# Patient Record
Sex: Male | Born: 1945 | Race: White | Hispanic: No | Marital: Married | Smoking: Former smoker
Health system: Southern US, Community
[De-identification: ages and names within clinical notes are randomized; demographics above are authoritative.]

## PROBLEM LIST (undated history)

## (undated) DIAGNOSIS — Z86010 Personal history of colonic polyps: Secondary | ICD-10-CM

## (undated) DIAGNOSIS — E13319 Other specified diabetes mellitus with unspecified diabetic retinopathy without macular edema: Secondary | ICD-10-CM

## (undated) DIAGNOSIS — Z1211 Encounter for screening for malignant neoplasm of colon: Secondary | ICD-10-CM

## (undated) DIAGNOSIS — C4491 Basal cell carcinoma of skin, unspecified: Secondary | ICD-10-CM

## (undated) HISTORY — PX: COLONOSCOPY: SHX174

## (undated) HISTORY — PX: EYE SURGERY: SHX253

## (undated) HISTORY — PX: OTHER SURGICAL HISTORY: SHX169

---

## 2002-05-30 ENCOUNTER — Ambulatory Visit (HOSPITAL_COMMUNITY): Admission: RE | Admit: 2002-05-30 | Discharge: 2002-05-30 | Payer: Self-pay | Admitting: Gastroenterology

## 2004-11-01 HISTORY — PX: OTHER SURGICAL HISTORY: SHX169

## 2005-01-25 ENCOUNTER — Ambulatory Visit: Payer: Self-pay | Admitting: Ophthalmology

## 2005-02-08 ENCOUNTER — Ambulatory Visit: Payer: Self-pay | Admitting: Ophthalmology

## 2005-07-21 ENCOUNTER — Ambulatory Visit: Payer: Self-pay | Admitting: Unknown Physician Specialty

## 2008-09-12 ENCOUNTER — Ambulatory Visit: Payer: Self-pay | Admitting: Unknown Physician Specialty

## 2010-08-04 ENCOUNTER — Ambulatory Visit: Payer: Self-pay

## 2011-09-03 IMAGING — CR DG CHEST 2V
1 series · 3 of 3 positions shown · non-contrast
Comparison: none

REASON FOR EXAM: cough
COMMENTS:

[Series 1: view not recorded · 0.17mm/px · 3 of 3 slices shown]
[im 1/3]
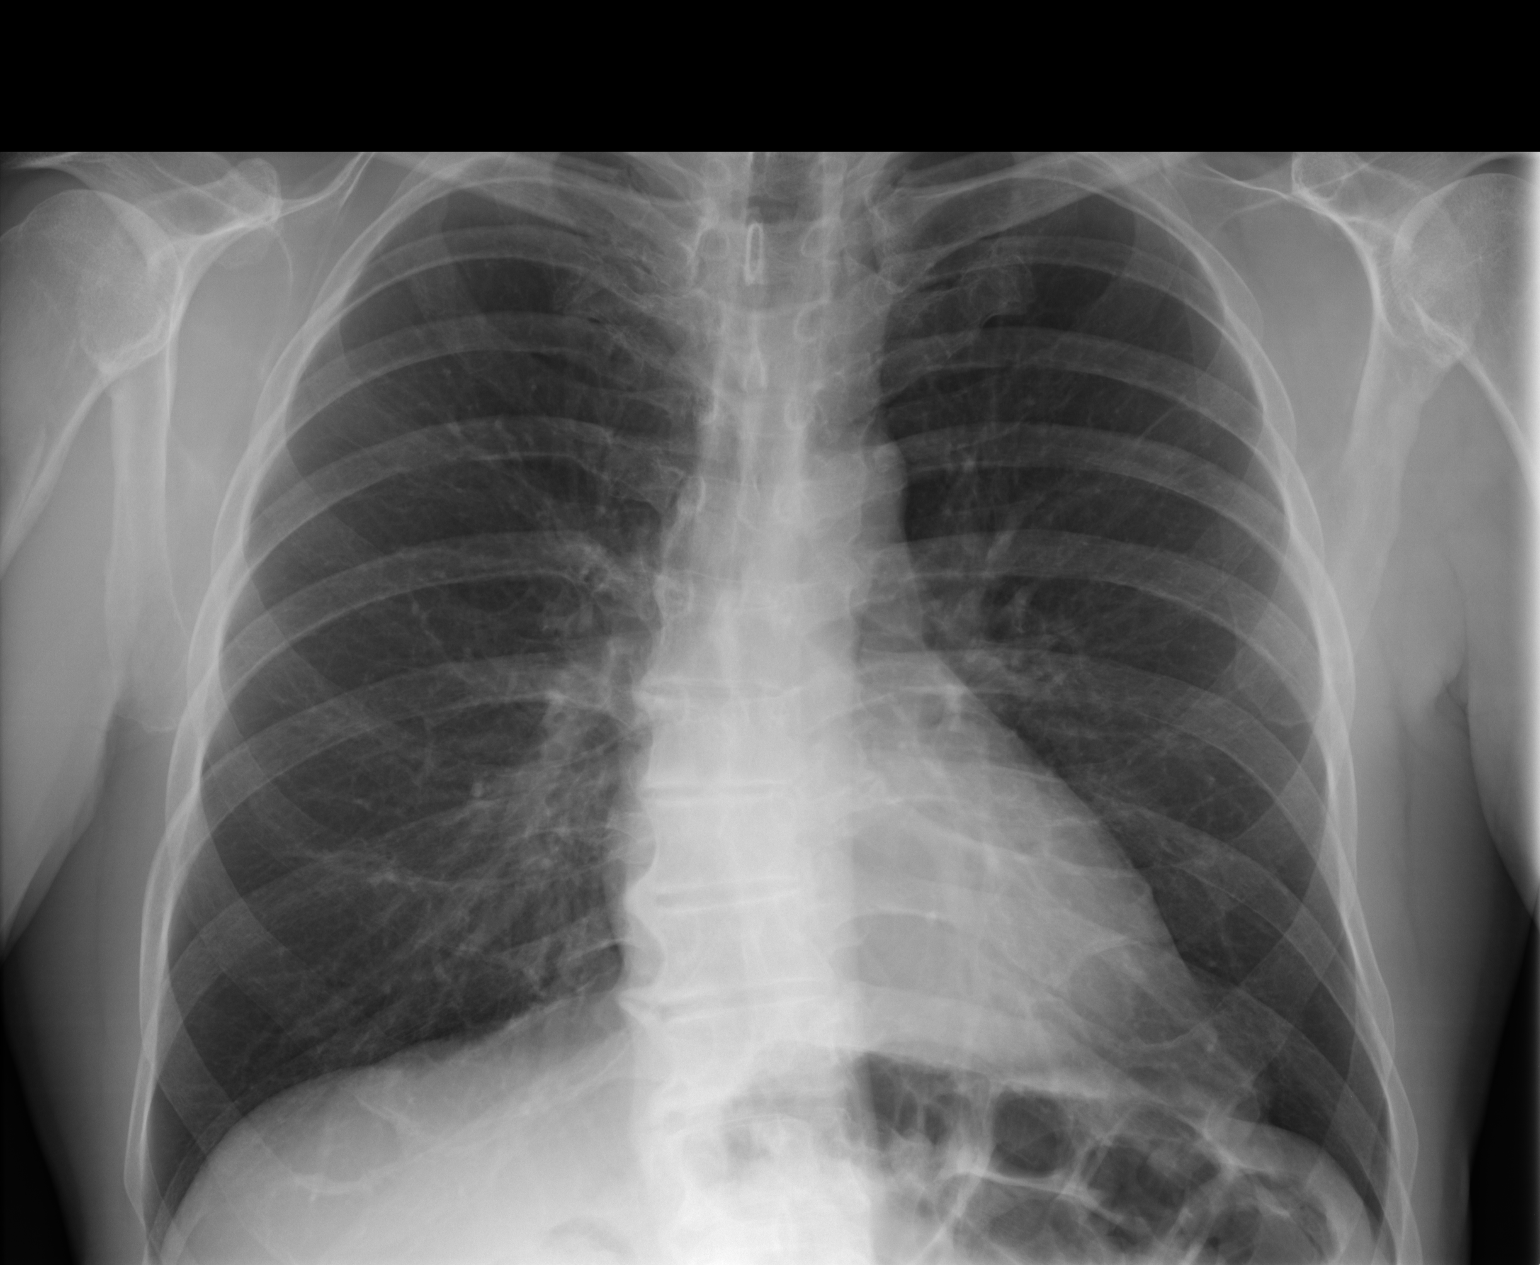
[im 2/3]
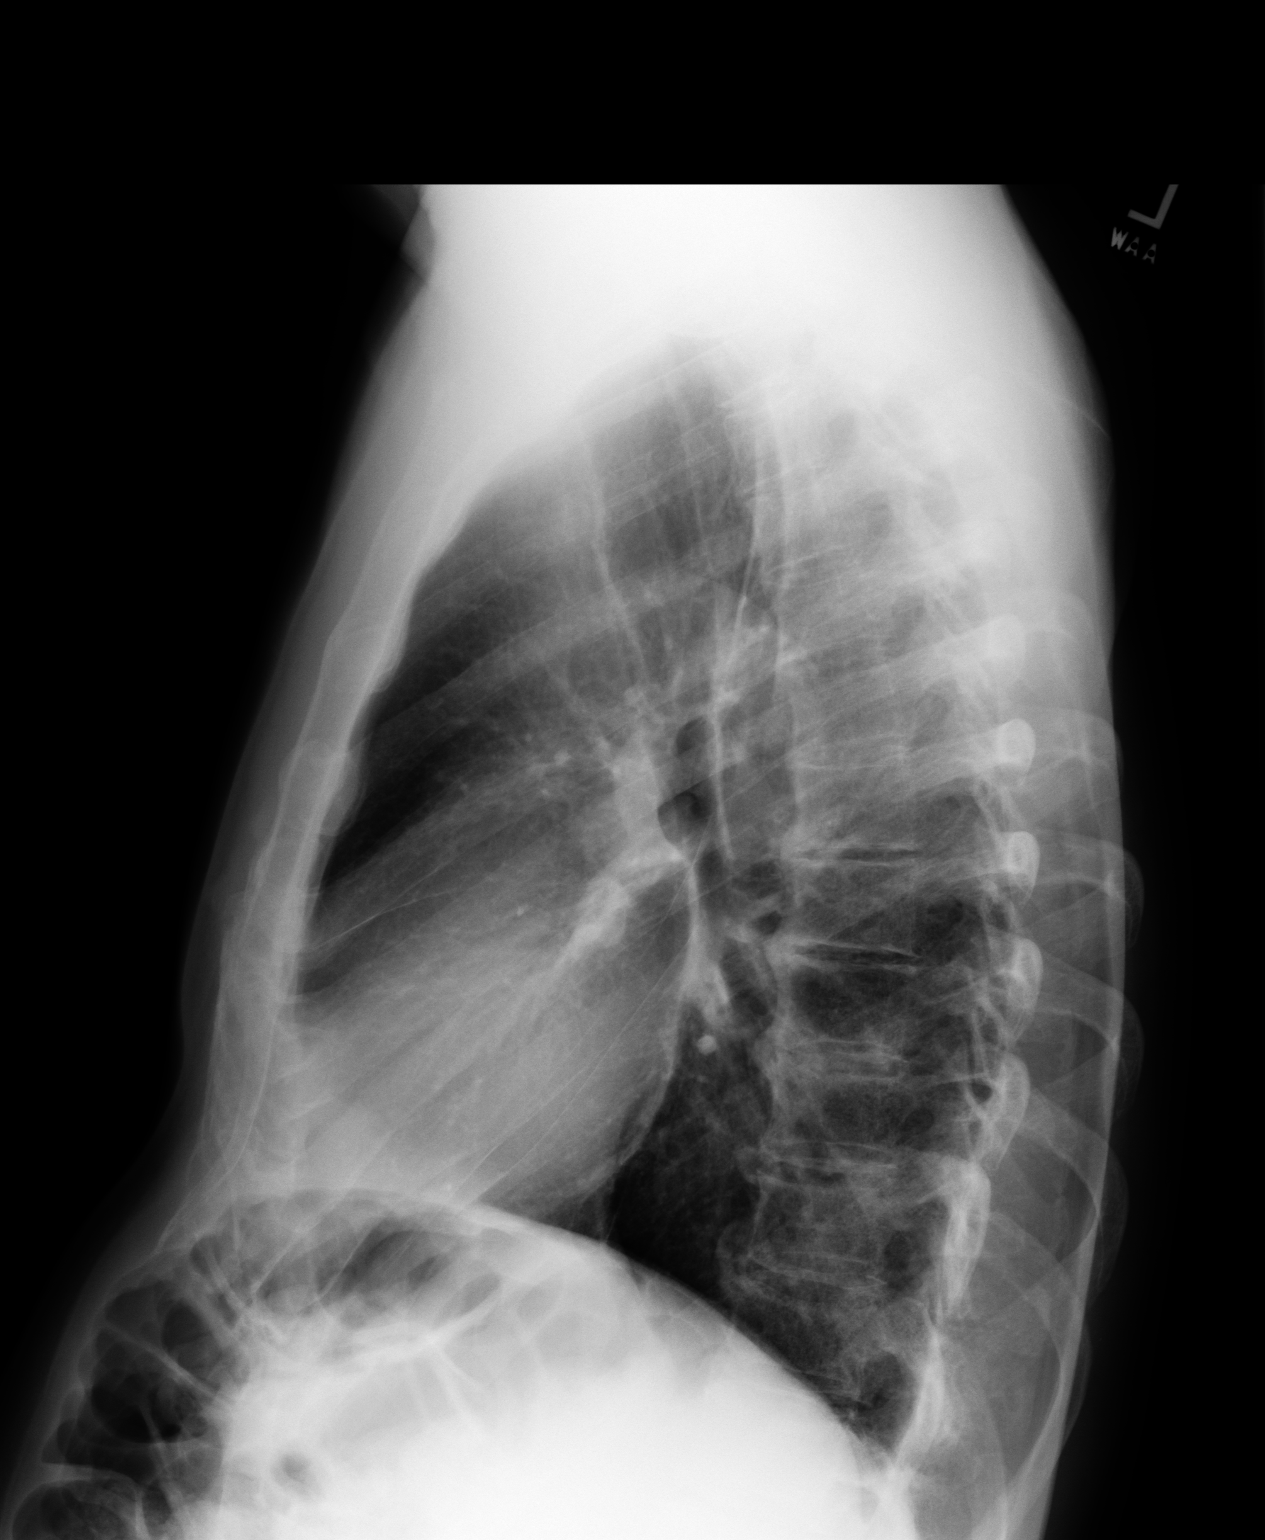
[im 3/3]
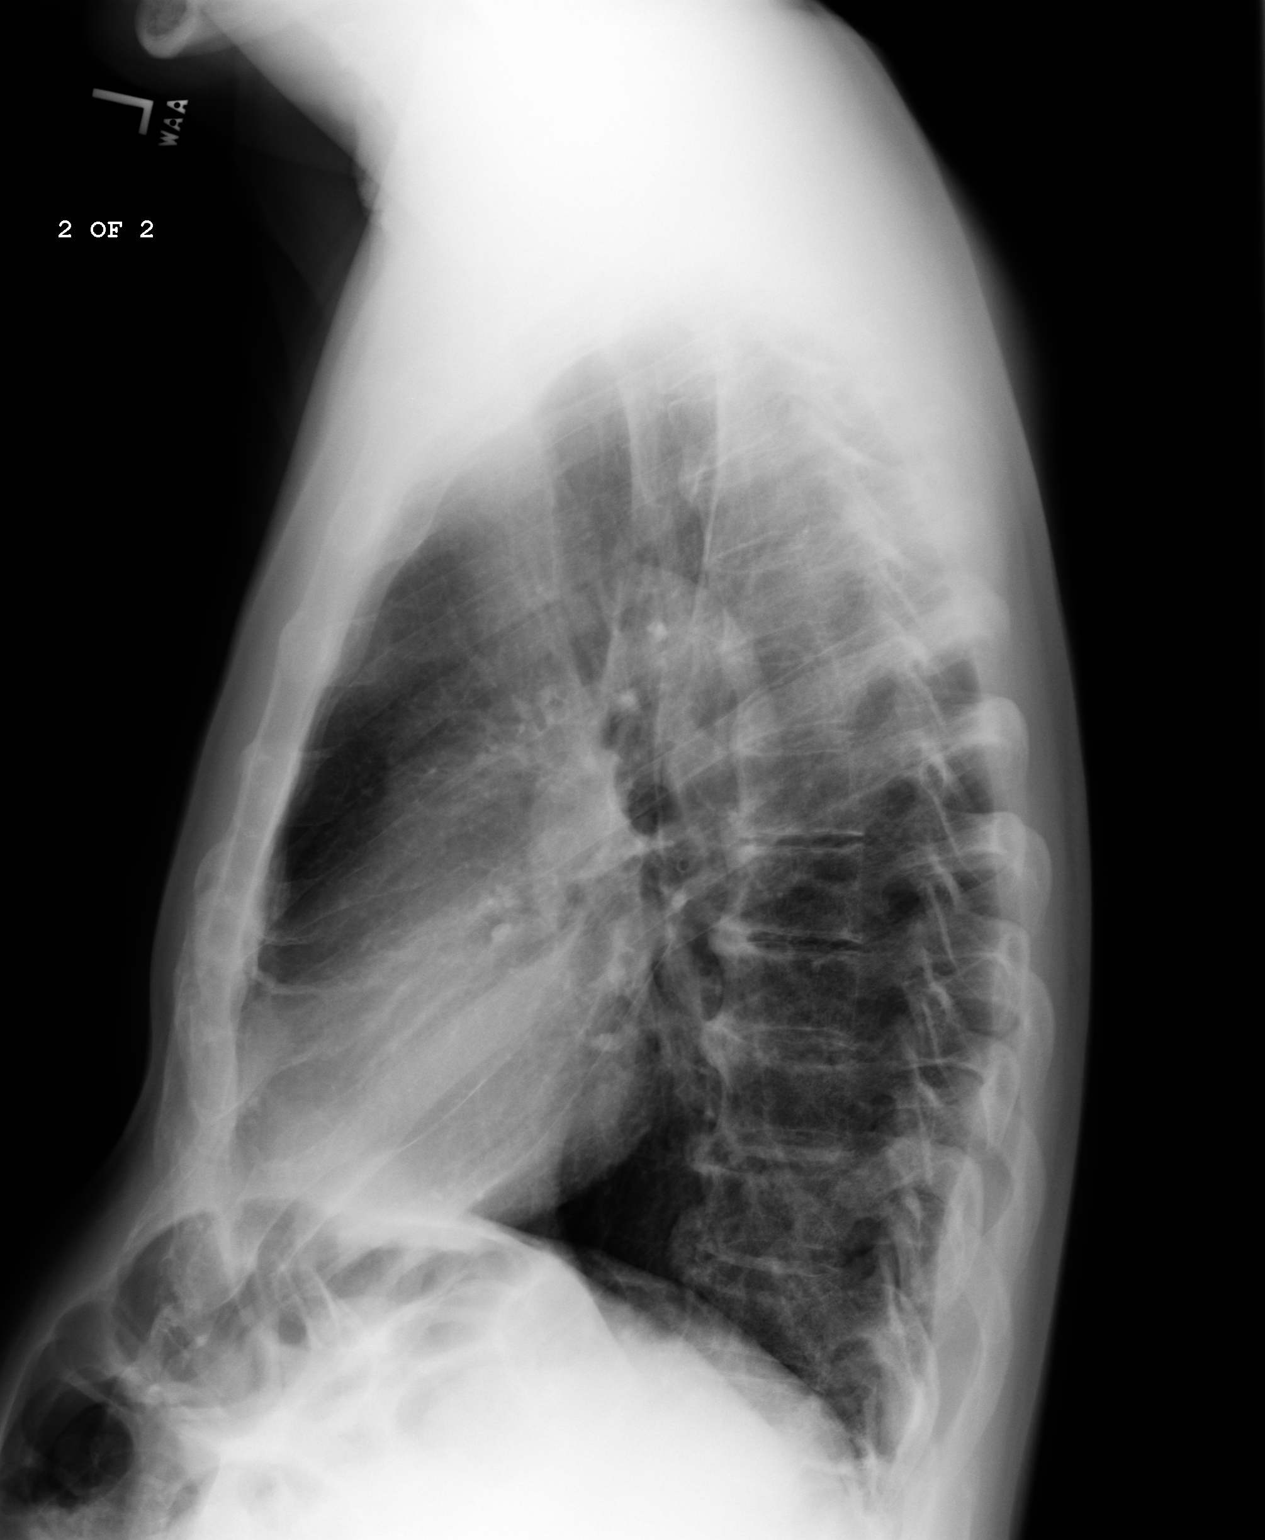

[3 of 3 positions shown; findings below may reference images not displayed]

PROCEDURE:     DXR - DXR CHEST PA (OR AP) AND LATERAL  - August 04, 2010  [DATE]

RESULT:     The lung fields are clear. No pneumonia, pneumothorax or pleural
effusion is seen. Heart size is normal. The mediastinal and osseous
structures show no acute changes. There is a mild thoracic scoliosis with
the convexity to the right. Hypertrophic spurring is noted anteriorly at
multiple levels of the thoracic spine.
IMPRESSION: 1.     No acute changes are identified.

## 2013-09-03 ENCOUNTER — Ambulatory Visit: Payer: Self-pay | Admitting: Unknown Physician Specialty

## 2013-10-08 ENCOUNTER — Ambulatory Visit: Payer: Self-pay | Admitting: Unknown Physician Specialty

## 2014-01-07 ENCOUNTER — Ambulatory Visit: Payer: Self-pay | Admitting: Family Medicine

## 2017-06-07 ENCOUNTER — Other Ambulatory Visit: Payer: Self-pay | Admitting: Family Medicine

## 2017-06-07 DIAGNOSIS — N6459 Other signs and symptoms in breast: Secondary | ICD-10-CM

## 2017-06-07 DIAGNOSIS — N644 Mastodynia: Secondary | ICD-10-CM

## 2017-06-15 ENCOUNTER — Ambulatory Visit
Admission: RE | Admit: 2017-06-15 | Discharge: 2017-06-15 | Disposition: A | Discharge status: Home / Self Care | Payer: Medicare Other | Source: Ambulatory Visit | Attending: Family Medicine | Admitting: Family Medicine

## 2017-06-15 DIAGNOSIS — N62 Hypertrophy of breast: Secondary | ICD-10-CM | POA: Diagnosis not present

## 2017-06-15 DIAGNOSIS — N6459 Other signs and symptoms in breast: Secondary | ICD-10-CM

## 2017-06-15 DIAGNOSIS — N644 Mastodynia: Secondary | ICD-10-CM | POA: Insufficient documentation

## 2017-07-27 ENCOUNTER — Ambulatory Visit: Payer: Medicare Other | Attending: Neurology

## 2017-07-27 DIAGNOSIS — F5101 Primary insomnia: Secondary | ICD-10-CM | POA: Insufficient documentation

## 2017-07-27 DIAGNOSIS — G4733 Obstructive sleep apnea (adult) (pediatric): Secondary | ICD-10-CM | POA: Insufficient documentation

## 2017-07-27 DIAGNOSIS — R0681 Apnea, not elsewhere classified: Secondary | ICD-10-CM | POA: Diagnosis present

## 2018-07-15 IMAGING — US US BREAST*L* LIMITED INC AXILLA
1 series · 2 of 2 positions shown · non-contrast
Comparison: Previous exam(s).

CLINICAL DATA: 70-year-old male with left periareolar tenderness
for 2-3 months. The patient denies feeling a lump. The patient has a
family history of breast cancer diagnosed in his mother in her 70s.

EXAM:
2D DIGITAL DIAGNOSTIC BILATERAL MAMMOGRAM WITH CAD AND ADJUNCT TOMO
ULTRASOUND LEFT BREAST

[Series 1: us breast*left* limited inc axilla · 0.03mm/px · 2 of 2 slices shown]
[im 1/2]
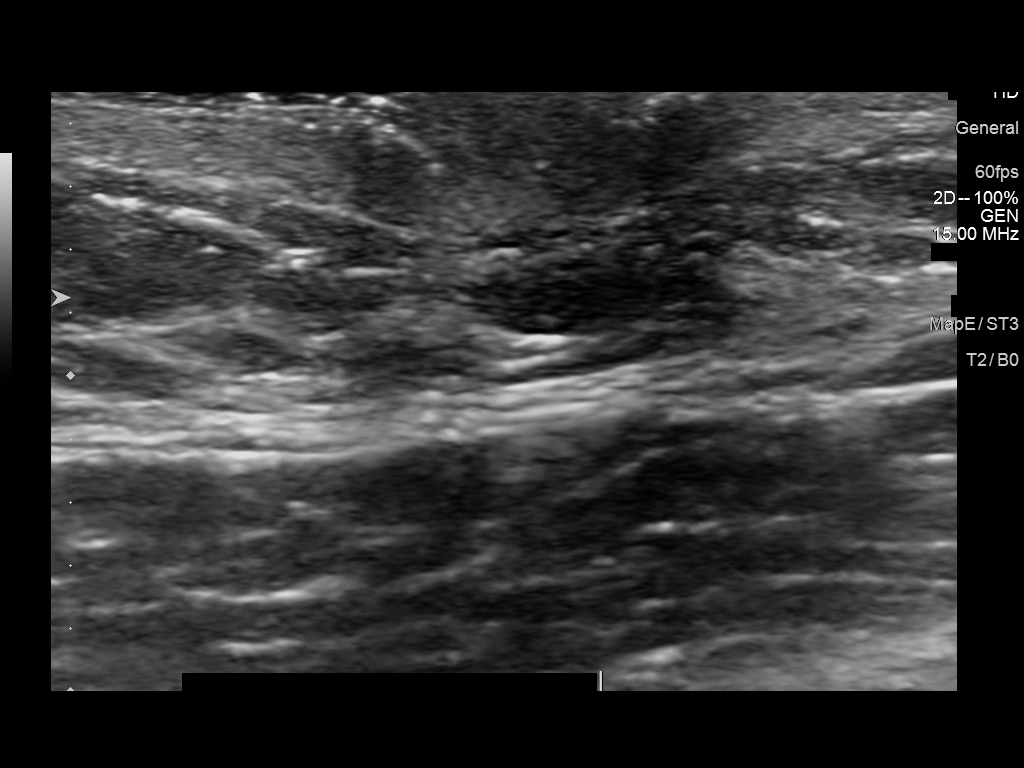
[im 2/2]
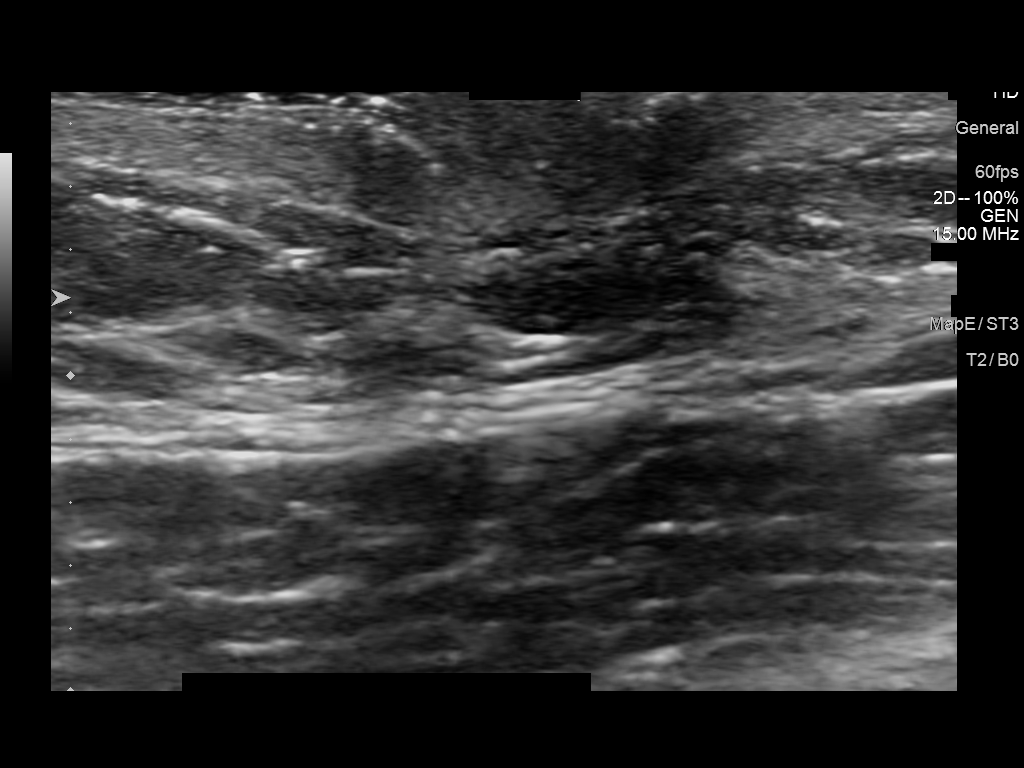

[2 of 2 positions shown; findings below may reference images not displayed]

ACR Breast Density Category b: There are scattered areas of
fibroglandular density.
FINDINGS: Mild left gynecomastia. No suspicious mass, calcifications, or other
abnormality is identified within either breast.

Mammographic images were processed with CAD.

On physical exam, there is mobile, subareolar left breast tissue
without discrete mass.

Targeted ultrasound of the periareolar and subareolar left breast
was performed demonstrating no suspicious cystic or solid
sonographic finding in the area of concern.
IMPRESSION: Mild left gynecomastia. No mammographic or sonographic evidence of
malignancy.

RECOMMENDATION:
Clinical management is recommended.

I have discussed the findings and recommendations with the patient.
Results were also provided in writing at the conclusion of the
visit. If applicable, a reminder letter will be sent to the patient
regarding the next appointment.

BI-RADS CATEGORY  2: Benign.

## 2018-10-27 ENCOUNTER — Encounter: Payer: Self-pay | Admitting: *Deleted

## 2018-10-30 ENCOUNTER — Encounter
Admission: RE | Disposition: A | Discharge status: Home / Self Care | Payer: Self-pay | Source: Home / Self Care | Attending: Unknown Physician Specialty

## 2018-10-30 ENCOUNTER — Ambulatory Visit
Admission: RE | Admit: 2018-10-30 | Discharge: 2018-10-30 | Disposition: A | Discharge status: Home / Self Care | Payer: Medicare Other | Attending: Unknown Physician Specialty | Admitting: Unknown Physician Specialty

## 2018-10-30 ENCOUNTER — Ambulatory Visit: Payer: Medicare Other | Admitting: Anesthesiology

## 2018-10-30 ENCOUNTER — Other Ambulatory Visit: Payer: Self-pay

## 2018-10-30 DIAGNOSIS — K640 First degree hemorrhoids: Secondary | ICD-10-CM | POA: Diagnosis not present

## 2018-10-30 DIAGNOSIS — Z7982 Long term (current) use of aspirin: Secondary | ICD-10-CM | POA: Insufficient documentation

## 2018-10-30 DIAGNOSIS — Z87891 Personal history of nicotine dependence: Secondary | ICD-10-CM | POA: Insufficient documentation

## 2018-10-30 DIAGNOSIS — Z79899 Other long term (current) drug therapy: Secondary | ICD-10-CM | POA: Diagnosis not present

## 2018-10-30 DIAGNOSIS — Q438 Other specified congenital malformations of intestine: Secondary | ICD-10-CM | POA: Insufficient documentation

## 2018-10-30 DIAGNOSIS — Z1211 Encounter for screening for malignant neoplasm of colon: Secondary | ICD-10-CM | POA: Insufficient documentation

## 2018-10-30 DIAGNOSIS — Z791 Long term (current) use of non-steroidal anti-inflammatories (NSAID): Secondary | ICD-10-CM | POA: Insufficient documentation

## 2018-10-30 DIAGNOSIS — Z86010 Personal history of colonic polyps: Secondary | ICD-10-CM | POA: Insufficient documentation

## 2018-10-30 DIAGNOSIS — E11319 Type 2 diabetes mellitus with unspecified diabetic retinopathy without macular edema: Secondary | ICD-10-CM | POA: Insufficient documentation

## 2018-10-30 DIAGNOSIS — Z794 Long term (current) use of insulin: Secondary | ICD-10-CM | POA: Insufficient documentation

## 2018-10-30 HISTORY — DX: Encounter for screening for malignant neoplasm of colon: Z12.11

## 2018-10-30 HISTORY — DX: Basal cell carcinoma of skin, unspecified: C44.91

## 2018-10-30 HISTORY — DX: Other specified diabetes mellitus with unspecified diabetic retinopathy without macular edema: E13.319

## 2018-10-30 HISTORY — PX: COLONOSCOPY WITH PROPOFOL: SHX5780

## 2018-10-30 HISTORY — DX: Personal history of colonic polyps: Z86.010

## 2018-10-30 LAB — GLUCOSE, CAPILLARY: Glucose-Capillary: 109 mg/dL — ABNORMAL HIGH (ref 70–99)

## 2018-10-30 SURGERY — COLONOSCOPY WITH PROPOFOL
Anesthesia: General

## 2018-10-30 MED ORDER — SODIUM CHLORIDE 0.9 % IV SOLN: INTRAVENOUS | Status: DC

## 2018-10-30 MED ORDER — PROPOFOL 500 MG/50ML IV EMUL
INTRAVENOUS | Status: AC
  Filled 2018-10-30: qty 50

## 2018-10-30 MED ORDER — SODIUM CHLORIDE 0.9 % IV SOLN
INTRAVENOUS | Status: DC
  Administered 2018-10-30: 07:00:00 via INTRAVENOUS

## 2018-10-30 MED ORDER — LIDOCAINE HCL (PF) 2 % IJ SOLN
INTRAMUSCULAR | Status: AC
  Filled 2018-10-30: qty 10

## 2018-10-30 MED ORDER — LIDOCAINE HCL (CARDIAC) PF 100 MG/5ML IV SOSY
PREFILLED_SYRINGE | INTRAVENOUS | Status: DC | PRN
  Administered 2018-10-30: 50 mg via INTRATRACHEAL
  Administered 2018-10-30: 30 mg via INTRATRACHEAL

## 2018-10-30 MED ORDER — PROPOFOL 500 MG/50ML IV EMUL
INTRAVENOUS | Status: DC | PRN
  Administered 2018-10-30: 80 ug/kg/min via INTRAVENOUS

## 2018-10-30 MED ORDER — PROPOFOL 10 MG/ML IV BOLUS
INTRAVENOUS | Status: DC | PRN
  Administered 2018-10-30: 100 mg via INTRAVENOUS

## 2018-10-30 NOTE — Anesthesia Post-op Follow-up Note (Signed)
Anesthesia QCDR form completed.        

## 2018-10-30 NOTE — Anesthesia Postprocedure Evaluation (Signed)
Anesthesia Post Note  Patient: Michael Booth  Procedure(s) Performed: COLONOSCOPY WITH PROPOFOL (N/A )  Patient location during evaluation: Endoscopy Anesthesia Type: General Level of consciousness: awake and alert Pain management: pain level controlled Vital Signs Assessment: post-procedure vital signs reviewed and stable Respiratory status: spontaneous breathing, nonlabored ventilation, respiratory function stable and patient connected to nasal cannula oxygen Cardiovascular status: blood pressure returned to baseline and stable Postop Assessment: no apparent nausea or vomiting Anesthetic complications: no     Last Vitals:  Vitals:   10/30/18 0829 10/30/18 0839  BP: (!) 138/100 (!) 146/80  Pulse: 74 64  Resp: 20 (!) 22  Temp:    SpO2: 100% 100%    Last Pain:  Vitals:   10/30/18 0709  PainSc: 0-No pain                 Martha Clan

## 2018-10-30 NOTE — Anesthesia Preprocedure Evaluation (Signed)
Anesthesia Evaluation  Patient identified by MRN, date of birth, ID band Patient awake    Reviewed: Allergy & Precautions, H&P , NPO status , Patient's Chart, lab work & pertinent test results, reviewed documented beta blocker date and time   History of Anesthesia Complications Negative for: history of anesthetic complications  Airway Mallampati: I  TM Distance: >3 FB Neck ROM: full    Dental no notable dental hx. (+) Dental Advidsory Given, Implants, Teeth Intact   Pulmonary neg pulmonary ROS, former smoker,           Cardiovascular Exercise Tolerance: Good negative cardio ROS       Neuro/Psych negative neurological ROS  negative psych ROS   GI/Hepatic negative GI ROS, Neg liver ROS,   Endo/Other  diabetes  Renal/GU negative Renal ROS  negative genitourinary   Musculoskeletal   Abdominal   Peds  Hematology negative hematology ROS (+)   Anesthesia Other Findings Past Medical History: No date: Basal cell carcinoma     Comment:  nose, neck, back and leg. 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[place])   Reproductive/Obstetrics negative OB ROS                             Anesthesia Physical Anesthesia Plan  ASA: II  Anesthesia Plan: General   Post-op Pain Management:    Induction: Intravenous  PONV Risk Score and Plan: 2 and Propofol infusion and TIVA  Airway Management Planned: Natural Airway and Nasal Cannula  Additional Equipment:   Intra-op Plan:   Post-operative Plan:   Informed Consent: I have reviewed the patients History and Physical, chart, labs and discussed the procedure including the risks, benefits and alternatives for the proposed anesthesia with the patient or authorized representative who has indicated his/her understanding and acceptance.   Dental Advisory Given  Plan  Discussed with: Anesthesiologist, CRNA and Surgeon  Anesthesia Plan Comments:         Anesthesia Quick Evaluation

## 2018-10-30 NOTE — H&P (Signed)
Primary Care Physician:  Juluis Pitch, MD Primary Gastroenterologist:  Dr. Vira Agar  Pre-Procedure History & Physical: HPI:  Michael Booth is a 72 y.o. male is here for an colonoscopy.  Last one done  09/03/2013.   Past Medical History:  Diagnosis Date  . Basal cell carcinoma    nose, neck, back and leg.  . Encounter for colonoscopy due to history of adenomatous colonic polyps   . Retinopathy due to secondary diabetes [facility])     Past Surgical History:  Procedure Laterality Date  . basal cell carcinoma excision right neck    . cataract extraction  2006  . COLONOSCOPY     05/30/02, 07/21/05, 09/12/08, 09/03/13, 10/08/13, 1980  . EYE SURGERY     laser eye surgery    Prior to Admission medications   Medication Sig Start Date End Date Taking? Authorizing Provider  ACIDOPHILUS LACTOBACILLUS PO Take by mouth once a week.   Yes [provider]  aspirin EC 81 MG tablet Take 81 mg by mouth daily.   Yes [provider]  Cyanocobalamin 2500 MCG TABS Take 2,500 mcg by mouth 2 (two) times daily.   Yes [provider]  GLUCOSAMINE SULFATE PO Take by mouth 2 (two) times daily.   Yes [provider]  IBUPROFEN-DIPHENHYDRAMINE HCL PO Take by mouth daily.   Yes [provider]  insulin lispro (HUMALOG) 100 UNIT/ML injection Inject 45 Units into the skin daily.   Yes [provider]  sildenafil (REVATIO) 20 MG tablet Take 20 mg by mouth as needed.   Yes [provider]  simvastatin (ZOCOR) 40 MG tablet Take 40 mg by mouth daily.   Yes [provider]    Allergies as of 08/30/2018  . (Not on File)    Family History  Problem Relation Age of Onset  . Heart attack Father   . Ulcers Father   . Alcohol abuse Brother   . Diabetes Maternal Aunt   . Heart attack Paternal Grandfather     Social History   Socioeconomic History  . Marital status: Married    Spouse name: Not on file  . Number of children: Not on file   . Years of education: Not on file  . Highest education level: Not on file  Occupational History  . Not on file  Social Needs  . Financial resource strain: Not on file  . Food insecurity:    Worry: Not on file    Inability: Not on file  . Transportation needs:    Medical: Not on file    Non-medical: Not on file  Tobacco Use  . Smoking status: Former Smoker    Years: 38.80    Types: Cigarettes    Last attempt to quit: 1980    Years since quitting: 40.0  . Smokeless tobacco: Never Used  Substance and Sexual Activity  . Alcohol use: Not Currently    Frequency: Never  . Drug use: Never  . Sexual activity: Yes  Lifestyle  . Physical activity:    Days per week: Not on file    Minutes per session: Not on file  . Stress: Not on file  Relationships  . Social connections:    Talks on phone: Not on file    Gets together: Not on file    Attends religious service: Not on file    Active member of club or organization: Not on file    Attends meetings of clubs or organizations: Not on file  Relationship status: Not on file  . Intimate partner violence:    Fear of current or ex partner: Not on file    Emotionally abused: Not on file    Physically abused: Not on file    Forced sexual activity: Not on file  Other Topics Concern  . Not on file  Social History Narrative  . Not on file    Review of Systems: See HPI, otherwise negative ROS  Physical Exam: BP (!) 157/80   Pulse 78   Temp (!) 96.7 F (35.9 C)   Resp 18   Ht 5\' 9"  (1.753 m)   Wt 68 kg   BMI 22.15 kg/m  General:   Alert,  pleasant and cooperative in NAD Head:  Normocephalic and atraumatic. Neck:  Supple; no masses or thyromegaly. Lungs:  Clear throughout to auscultation.    Heart:  Regular rate and rhythm. Abdomen:  Soft, nontender and nondistended. Normal bowel sounds, without guarding, and without rebound.   Neurologic:  Alert and  oriented x4;  grossly normal neurologically.  Impression/Plan: Michael Booth is here for an colonoscopy to be performed for [place] colon polyps.  Risks, benefits, limitations, and alternatives regarding  colonoscopy have been reviewed with the patient.  Questions have been answered.  All parties agreeable.   Gaylyn Cheers, MD  10/30/2018, 7:26 AM

## 2018-10-30 NOTE — Op Note (Signed)
[facility] Patient Name: Michael Booth Procedure Date: 10/30/2018 7:34 AM MRN: [id number] Account #: [id number] Date of Birth: Oct 29, 1946 Admit Type: Outpatient Age: 72 Room: [facility] ENDO ROOM 1 Gender: Male Note Status: Finalized Procedure:            Colonoscopy Indications:          High risk colon cancer surveillance: Personal history                        of colonic polyps Providers:            Manya Silvas, MD Referring MD:         No Local Md, MD (Referring MD) Medicines:            Propofol per Anesthesia Complications:        No immediate complications. Procedure:            Pre-Anesthesia Assessment:                       - After reviewing the risks and benefits, the patient                        was deemed in satisfactory condition to undergo the                        procedure.                       After obtaining informed consent, the colonoscope was                        passed under direct vision. Throughout the procedure,                        the patient's blood pressure, pulse, and oxygen                        saturations were monitored continuously. The                        Colonoscope was introduced through the anus and                        advanced to the the cecum, identified by appendiceal                        orifice and ileocecal valve. The colonoscopy was                        somewhat difficult due to restricted mobility of the                        colon, a redundant colon and a tortuous colon.                        Successful completion of the procedure was aided by                        applying abdominal pressure. The patient tolerated the  procedure well. The quality of the bowel preparation                        was good. Findings:      Internal hemorrhoids were found during endoscopy. The hemorrhoids were       small and Grade I (internal hemorrhoids that do not  prolapse). At the       distal sigmoid where a sharp turn caused focal irritation with trace of       bleeding.      No polyps or tumors seen.      The exam was otherwise without abnormality. Impression:           - Internal hemorrhoids.                       - The examination was otherwise normal.                       - No specimens collected. Recommendation:       - The findings and recommendations were discussed with                        the patient's family. Manya Silvas, MD 10/30/2018 8:11:43 AM This report has been signed electronically. Number of Addenda: 0 Note Initiated On: 10/30/2018 7:34 AM Scope Withdrawal Time: 0 hours 11 minutes 27 seconds  Total Procedure Duration: 0 hours 24 minutes 44 seconds       [facility]

## 2018-10-30 NOTE — Transfer of Care (Signed)
Immediate Anesthesia Transfer of Care Note  Patient: Marcello Fennel Roback  Procedure(s) Performed: COLONOSCOPY WITH PROPOFOL (N/A )  Patient Location: PACU and Endoscopy Unit  Anesthesia Type:General  Level of Consciousness: awake, alert  and oriented  Airway & Oxygen Therapy: Patient Spontanous Breathing  Post-op Assessment: Report given to RN and Post -op Vital signs reviewed and stable  Post vital signs: Reviewed and stable  Last Vitals:  Vitals Value Taken Time  BP 110/63 10/30/2018  8:09 AM  Temp 36.1 C 10/30/2018  8:09 AM  Pulse 69 10/30/2018  8:10 AM  Resp 9 10/30/2018  8:10 AM  SpO2 100 % 10/30/2018  8:10 AM  Vitals shown include unvalidated device data.  Last Pain:  Vitals:   10/30/18 0709  PainSc: 0-No pain         Complications: No apparent anesthesia complications

## 2018-10-31 ENCOUNTER — Encounter: Payer: Self-pay | Admitting: Unknown Physician Specialty
